# Patient Record
Sex: Female | Born: 1990 | Race: Black or African American | Hispanic: No | Marital: Single | Smoking: Current every day smoker
Health system: Southern US, Community
[De-identification: ages and names within clinical notes are randomized; demographics above are authoritative.]

## PROBLEM LIST (undated history)

## (undated) DIAGNOSIS — J45909 Unspecified asthma, uncomplicated: Secondary | ICD-10-CM

---

## 2014-11-17 ENCOUNTER — Encounter (HOSPITAL_COMMUNITY): Payer: Self-pay | Admitting: Emergency Medicine

## 2014-11-17 ENCOUNTER — Emergency Department (HOSPITAL_COMMUNITY)
Admission: EM | Admit: 2014-11-17 | Discharge: 2014-11-17 | Disposition: A | Discharge status: Home / Self Care | Payer: No Typology Code available for payment source | Attending: Emergency Medicine | Admitting: Emergency Medicine

## 2014-11-17 ENCOUNTER — Emergency Department (HOSPITAL_COMMUNITY): Payer: No Typology Code available for payment source

## 2014-11-17 DIAGNOSIS — S79912A Unspecified injury of left hip, initial encounter: Secondary | ICD-10-CM | POA: Diagnosis not present

## 2014-11-17 DIAGNOSIS — S199XXA Unspecified injury of neck, initial encounter: Secondary | ICD-10-CM | POA: Diagnosis not present

## 2014-11-17 DIAGNOSIS — S299XXA Unspecified injury of thorax, initial encounter: Secondary | ICD-10-CM | POA: Insufficient documentation

## 2014-11-17 DIAGNOSIS — Y9389 Activity, other specified: Secondary | ICD-10-CM | POA: Insufficient documentation

## 2014-11-17 DIAGNOSIS — S3992XA Unspecified injury of lower back, initial encounter: Secondary | ICD-10-CM | POA: Insufficient documentation

## 2014-11-17 DIAGNOSIS — Y92410 Unspecified street and highway as the place of occurrence of the external cause: Secondary | ICD-10-CM | POA: Insufficient documentation

## 2014-11-17 DIAGNOSIS — Z3202 Encounter for pregnancy test, result negative: Secondary | ICD-10-CM | POA: Diagnosis not present

## 2014-11-17 DIAGNOSIS — Z720 Tobacco use: Secondary | ICD-10-CM | POA: Insufficient documentation

## 2014-11-17 DIAGNOSIS — S4992XA Unspecified injury of left shoulder and upper arm, initial encounter: Secondary | ICD-10-CM | POA: Insufficient documentation

## 2014-11-17 DIAGNOSIS — Y998 Other external cause status: Secondary | ICD-10-CM | POA: Diagnosis not present

## 2014-11-17 DIAGNOSIS — J45909 Unspecified asthma, uncomplicated: Secondary | ICD-10-CM | POA: Insufficient documentation

## 2014-11-17 HISTORY — DX: Unspecified asthma, uncomplicated: J45.909

## 2014-11-17 LAB — COMPREHENSIVE METABOLIC PANEL
ALK PHOS: 40 U/L (ref 38–126)
ALT: 17 U/L (ref 14–54)
AST: 21 U/L (ref 15–41)
Albumin: 3.4 g/dL — ABNORMAL LOW (ref 3.5–5.0)
Anion gap: 5 (ref 5–15)
BILIRUBIN TOTAL: 0.6 mg/dL (ref 0.3–1.2)
BUN: 12 mg/dL (ref 6–20)
CO2: 25 mmol/L (ref 22–32)
Calcium: 8.6 mg/dL — ABNORMAL LOW (ref 8.9–10.3)
Chloride: 105 mmol/L (ref 101–111)
Creatinine, Ser: 0.76 mg/dL (ref 0.44–1.00)
GFR calc Af Amer: >60 mL/min (ref >60)
GLUCOSE: 108 mg/dL — AB (ref 65–99)
Potassium: 3.5 mmol/L (ref 3.5–5.1)
Sodium: 135 mmol/L (ref 135–145)
TOTAL PROTEIN: 6.4 g/dL — AB (ref 6.5–8.1)

## 2014-11-17 LAB — CBC WITH DIFFERENTIAL/PLATELET
BASOS ABS: 0.1 10*3/uL (ref 0.0–0.1)
BASOS PCT: 1 % (ref 0–1)
Eosinophils Absolute: 0.3 10*3/uL (ref 0.0–0.7)
Eosinophils Relative: 5 % (ref 0–5)
HEMATOCRIT: 37.5 % (ref 36.0–46.0)
HEMOGLOBIN: 12.4 g/dL (ref 12.0–15.0)
Lymphocytes Relative: 49 % — ABNORMAL HIGH (ref 12–46)
Lymphs Abs: 3 10*3/uL (ref 0.7–4.0)
MCH: 31.4 pg (ref 26.0–34.0)
MCHC: 33.1 g/dL (ref 30.0–36.0)
MCV: 94.9 fL (ref 78.0–100.0)
MONOS PCT: 6 % (ref 3–12)
Monocytes Absolute: 0.4 10*3/uL (ref 0.1–1.0)
NEUTROS ABS: 2.5 10*3/uL (ref 1.7–7.7)
Neutrophils Relative %: 39 % — ABNORMAL LOW (ref 43–77)
Platelets: 270 10*3/uL (ref 150–400)
RBC: 3.95 MIL/uL (ref 3.87–5.11)
RDW: 12.2 % (ref 11.5–15.5)
WBC: 6.3 10*3/uL (ref 4.0–10.5)

## 2014-11-17 LAB — URINALYSIS, ROUTINE W REFLEX MICROSCOPIC
BILIRUBIN URINE: NEGATIVE
GLUCOSE, UA: NEGATIVE mg/dL
Hgb urine dipstick: NEGATIVE
Ketones, ur: NEGATIVE mg/dL
Leukocytes, UA: NEGATIVE
Nitrite: NEGATIVE
PROTEIN: NEGATIVE mg/dL
SPECIFIC GRAVITY, URINE: 1.031 — AB (ref 1.005–1.030)
Urobilinogen, UA: 1 mg/dL (ref 0.0–1.0)
pH: 6.5 (ref 5.0–8.0)

## 2014-11-17 LAB — I-STAT BETA HCG BLOOD, ED (MC, WL, AP ONLY): I-stat hCG, quantitative: <5 m[IU]/mL (ref <5)

## 2014-11-17 LAB — POC URINE PREG, ED: Preg Test, Ur: NEGATIVE

## 2014-11-17 MED ORDER — IOHEXOL 300 MG/ML  SOLN
100.0000 mL | Freq: Once | INTRAMUSCULAR | Status: AC | PRN
  Administered 2014-11-17: 100 mL via INTRAVENOUS
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
## 2014-11-17 NOTE — Discharge Instructions (Signed)
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
